# Patient Record
Sex: Female | Born: 1960 | Race: Black or African American | Hispanic: No | Marital: Married | Smoking: Never smoker
Health system: Southern US, Community
[De-identification: ages and names within clinical notes are randomized; demographics above are authoritative.]

## PROBLEM LIST (undated history)

## (undated) DIAGNOSIS — E785 Hyperlipidemia, unspecified: Secondary | ICD-10-CM

---

## 1999-05-08 ENCOUNTER — Encounter: Payer: Self-pay | Admitting: Obstetrics & Gynecology

## 1999-05-08 ENCOUNTER — Inpatient Hospital Stay (HOSPITAL_COMMUNITY): Admission: AD | Admit: 1999-05-08 | Discharge: 1999-05-08 | Payer: Self-pay | Admitting: Obstetrics

## 1999-05-12 ENCOUNTER — Encounter (INDEPENDENT_AMBULATORY_CARE_PROVIDER_SITE_OTHER): Payer: Self-pay | Admitting: Specialist

## 1999-05-12 ENCOUNTER — Ambulatory Visit (HOSPITAL_COMMUNITY): Admission: RE | Admit: 1999-05-12 | Discharge: 1999-05-12 | Payer: Self-pay | Admitting: Obstetrics

## 2000-09-27 ENCOUNTER — Emergency Department (HOSPITAL_COMMUNITY): Admission: EM | Admit: 2000-09-27 | Discharge: 2000-09-28 | Payer: Self-pay | Admitting: Emergency Medicine

## 2000-09-28 ENCOUNTER — Encounter: Payer: Self-pay | Admitting: Emergency Medicine

## 2000-11-11 ENCOUNTER — Ambulatory Visit (HOSPITAL_COMMUNITY): Admission: RE | Admit: 2000-11-11 | Discharge: 2000-11-11 | Payer: Self-pay | Admitting: Gastroenterology

## 2001-01-11 ENCOUNTER — Inpatient Hospital Stay (HOSPITAL_COMMUNITY): Admission: RE | Admit: 2001-01-11 | Discharge: 2001-01-12 | Payer: Self-pay | Admitting: Gynecology

## 2001-01-11 ENCOUNTER — Encounter (INDEPENDENT_AMBULATORY_CARE_PROVIDER_SITE_OTHER): Payer: Self-pay | Admitting: Specialist

## 2003-07-22 ENCOUNTER — Encounter: Admission: RE | Admit: 2003-07-22 | Discharge: 2003-07-22 | Payer: Self-pay | Admitting: Gynecology

## 2003-07-22 ENCOUNTER — Encounter: Payer: Self-pay | Admitting: Gynecology

## 2003-07-23 ENCOUNTER — Other Ambulatory Visit: Admission: RE | Admit: 2003-07-23 | Discharge: 2003-07-23 | Payer: Self-pay | Admitting: Gynecology

## 2004-08-06 ENCOUNTER — Encounter: Admission: RE | Admit: 2004-08-06 | Discharge: 2004-08-06 | Payer: Self-pay | Admitting: Gynecology

## 2004-08-26 ENCOUNTER — Other Ambulatory Visit: Admission: RE | Admit: 2004-08-26 | Discharge: 2004-08-26 | Payer: Self-pay | Admitting: Gynecology

## 2005-08-31 ENCOUNTER — Other Ambulatory Visit: Admission: RE | Admit: 2005-08-31 | Discharge: 2005-08-31 | Payer: Self-pay | Admitting: Gynecology

## 2005-09-22 ENCOUNTER — Encounter: Admission: RE | Admit: 2005-09-22 | Discharge: 2005-09-22 | Payer: Self-pay | Admitting: Gynecology

## 2005-10-11 ENCOUNTER — Ambulatory Visit (HOSPITAL_COMMUNITY): Admission: RE | Admit: 2005-10-11 | Discharge: 2005-10-11 | Payer: Self-pay | Admitting: Gynecology

## 2005-11-24 ENCOUNTER — Ambulatory Visit (HOSPITAL_COMMUNITY): Admission: RE | Admit: 2005-11-24 | Discharge: 2005-11-24 | Payer: Self-pay | Admitting: Gastroenterology

## 2006-10-06 ENCOUNTER — Encounter: Admission: RE | Admit: 2006-10-06 | Discharge: 2006-10-06 | Payer: Self-pay | Admitting: Family Medicine

## 2006-10-10 ENCOUNTER — Other Ambulatory Visit: Admission: RE | Admit: 2006-10-10 | Discharge: 2006-10-10 | Payer: Self-pay | Admitting: Gynecology

## 2007-09-15 ENCOUNTER — Encounter: Admission: RE | Admit: 2007-09-15 | Discharge: 2007-09-15 | Payer: Self-pay | Admitting: Gynecology

## 2007-10-09 ENCOUNTER — Encounter: Admission: RE | Admit: 2007-10-09 | Discharge: 2007-10-09 | Payer: Self-pay | Admitting: Gynecology

## 2007-10-16 ENCOUNTER — Other Ambulatory Visit: Admission: RE | Admit: 2007-10-16 | Discharge: 2007-10-16 | Payer: Self-pay | Admitting: Gynecology

## 2008-08-15 ENCOUNTER — Encounter: Admission: RE | Admit: 2008-08-15 | Discharge: 2008-08-15 | Payer: Self-pay | Admitting: Family Medicine

## 2008-10-29 ENCOUNTER — Encounter: Admission: RE | Admit: 2008-10-29 | Discharge: 2008-10-29 | Payer: Self-pay | Admitting: Gynecology

## 2010-01-01 ENCOUNTER — Encounter: Admission: RE | Admit: 2010-01-01 | Discharge: 2010-01-01 | Payer: Self-pay | Admitting: Gynecology

## 2010-10-18 ENCOUNTER — Encounter: Payer: Self-pay | Admitting: Family Medicine

## 2011-01-14 ENCOUNTER — Other Ambulatory Visit: Payer: Self-pay | Admitting: Family Medicine

## 2011-01-14 DIAGNOSIS — Z1231 Encounter for screening mammogram for malignant neoplasm of breast: Secondary | ICD-10-CM

## 2011-01-29 ENCOUNTER — Other Ambulatory Visit: Payer: Self-pay | Admitting: Gynecology

## 2011-01-29 ENCOUNTER — Ambulatory Visit: Payer: Self-pay

## 2011-01-29 DIAGNOSIS — R928 Other abnormal and inconclusive findings on diagnostic imaging of breast: Secondary | ICD-10-CM

## 2011-02-11 ENCOUNTER — Ambulatory Visit
Admission: RE | Admit: 2011-02-11 | Discharge: 2011-02-11 | Disposition: A | Discharge status: Home / Self Care | Payer: 59 | Source: Ambulatory Visit | Attending: Gynecology | Admitting: Gynecology

## 2011-02-11 DIAGNOSIS — R928 Other abnormal and inconclusive findings on diagnostic imaging of breast: Secondary | ICD-10-CM

## 2011-02-12 NOTE — Procedures (Signed)
[place]  Patient:    Natasha Russo, Natasha Russo                      MRN: [id number] Proc. Date: 11/11/00 Adm. Date:  [id number] Attending:  Charna Elizabeth CC:         Leatha Gilding. Mezer, M.D.             Gabriel Earing, M.D.                           Procedure Report  DATE OF BIRTH:  April 23, 1951.  REFERRING PHYSICIAN:  Leatha Gilding. Mezer, M.D.  PROCEDURE PERFORMED:  Colonoscopy.  ENDOSCOPIST:  Anselmo Rod, M.D.  INSTRUMENT USED:  Olympus video colonoscope.  INDICATIONS FOR PROCEDURE:  A 50 year old African-American female with an abnormal rectal exam, questionable mass on digital exam, rule out colonic polyp.  Patient has a family history of colon cancer.  PREPROCEDURE PREPARATION:  Informed consent was procured from the patient. The patient was fasted for eight hours prior to the procedure and prepped with a bottle of magnesium citrate and a gallon of NuLytely the night prior to the procedure.  PREPROCEDURE PHYSICAL:  The patient had stable vital signs.  Neck supple. Chest clear to auscultation.  S1, S2 regular.  Abdomen soft with normal abdominal bowel sounds.  DESCRIPTION OF PROCEDURE:  The patient was placed in the left lateral decubitus position and sedated with 80 mg of Demerol and 8 mg of Versed intravenously.  Once the patient was adequately sedated and maintained on low-flow oxygen and continuous cardiac monitoring, the Olympus video colonoscope was advanced from the rectum to the cecum with slight difficulty secondary to a very tortuous colon.  The patient has had a cesarean section in the pass.  No mass or polyps were seen.  No diverticulosis was present.  The entire colonic mucosa appeared healthy with normal vascular pattern.  The procedure was complete up to the cecum.  The rectum was closely visualized and retroflexion view showed no abnormalities.  IMPRESSION:  Normal colonoscopy examination up to the cecum.  No masses or polyps  seen.  RECOMMENDATIONS: 1. The patient has been advised to increase the fluid and fiber in the diet. 2. Repeat rectal examination will be done in the office and colorectal cancer    screening will be repeated in the next five years considering her    family history of colon cancer unless she were to develop any abnormal    symptoms in the interim.DD:  11/11/00 TD:  11/11/00 Job: [id number]

## 2011-02-12 NOTE — Op Note (Signed)
[facility]  Patient:    Natasha Russo, Natasha Russo                   MRN: [id number] Proc. Date: 01/11/01 Adm. Date:  [id number] Disc. Date: [id number] Attending:  Teodora Medici Cabitt                           Operative Report  PREOPERATIVE DIAGNOSES:  Pelvic pain, fibroid uterus.  POSTOPERATIVE DIAGNOSES:  Pelvic pain, fibroid uterus.  OPERATION PERFORMED:  Total abdominal hysterectomy.  SURGEON:  Dr. Teodora Medici.  ASSISTANT:  Dr. Rosalee Kaufman.  ANESTHESIA:  General endotracheal.  PREPARATION:  Betadine.  DESCRIPTION OF PROCEDURE:  With the patient in the supine position, she was prepped and draped in the routine fashion. A Pfannenstiel incision was made above the previous scar as it appeared to be too low for a hysterectomy. The subcutaneous tissue, fascia, and peritoneum were opened without difficulty. Brief exploration of her upper abdomen was benign. Exploration of the pelvis revealed the uterus to be approximately 12 weeks in size with multiple leiomyomata. Both ovaries were grossly normal. The round ligaments were suture ligated with #1 chromic and divided and with cautery. The utero-ovarian ligaments were isolated, clamped, cut and free tied with #1 chromic and then suture ligated with #1 chromic. The bladder was quite adherent to the cervix and required careful sharp dissection. The dissection was taken deep to the cervix to protect the bladder. There appeared to be no harm to the bladder. The uterine arteries are clamped, cut and suture ligated with #1 chromic. One bit of the cardinal ligament was taken on each side, clamped, cut and suture ligated with #1 chromic. The uterus was then removed for better exposure. The remainder of the cardinal ligaments were taken in several bits, clamped, cut and suture ligated with #1 chromic. The uterosacral ligaments were taken separately, clamped, cut and suture ligated with #1 chromic. The vagina  was entered anteriorly and the specimen excised with circumferential dissection. The cervix was intact. There was significant scarring of the cervix to the lateral vaginal wall particularly on the right side. The angles of the cuff were closed with TeLinde type angled sutures of #1 chromic. The cuff was then whipped anteriorly and posteriorly with running locked #1 chromic suture. Hemostasis was assured in the operative site and the cuff was reapproximated using interrupted #1 chromic suture. The bladder flap was then placed over the cuff with a running 3-0 Vicryl suture. The ureters were inspected and found to be out of harms way and hemostasis appeared to be intact. An effort was made to place the large bowel in the cul-de-sac, the omentum was brought down and the abdomen was closed in layers using a running 2-0 Vicryl on the peritoneum, running #0 Vicryl to the midline bilaterally on the fascia. Hemostasis was assured in the subcutaneous tissue and the skin was closed with staples. The estimated blood loss was 350 cc. The patient tolerated the procedure well and was taken to the recovery room in satisfactory condition. DD:  01/11/01 TD:  01/12/01 Job: [id number]

## 2011-02-12 NOTE — H&P (Signed)
[facility]  Patient:    Natasha Russo, Natasha Russo                   MRN: [id number] Adm. Date:  [id number] Disc. Date: [id number] Attending:  Teodora Medici Cabitt                         History and Physical  ADMITTING DIAGNOSES:  Pelvic pain and fibroid uterus.  HISTORY OF PRESENT ILLNESS:  The patient is a 50 year old, gravida 3, para 1, AB 2 female admitted with last menstrual period of January 04, 2001, with longstanding increasing pelvic pain and a fibroid uterus for a total abdominal hysterectomy, question bilateral salpingo-oophorectomy.  The patient has a long history of dyspareunia and dysmenorrhea and fibroids.  Currently the patient has very severe incapacitating dysmenorrhea and is unable to have intercourse secondary to pain.  The fibroid uterus is approximately 12 weeks in size and irregular and quite tender to palpation.  The patient wishes to proceed with hysterectomy.  The patient understands that this will result in permanent sterilization and she will never be able to become pregnant again in the future.  She wishes to have her ovaries remove only if there is significant pathology or if surgically necessary.  Postoperative expectations and restrictions have been reviewed with the patient in detail.  Potential complications including, but not limited to, anesthesia, injury to the bowel, bladder, or ureters, possible fistula formation, possible blood loss with transfusion, sequela, and possible infection have been reviewed in detail.  Ultrasound examination reveals the ovaries to be grossly normal.  The patient understands that there is no guarantee to resolve the dyspareunia and that she will no longer have menstrual periods.  PAST MEDICAL HISTORY:  Surgical:  Cesarean section, D&C.  Medical: Noncontributory.  MEDICATIONS:  Cortisone cream.  ALLERGIES:  PENICILLIN (possible allergy, it may just be nausea.)  HABITS:  Smokes none, EtOH  none.  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  FAMILY HISTORY:  Negative for carcinoma.  PHYSICAL EXAMINATION:  HEENT:  Negative.  LUNGS:  Clear.  HEART:  Without murmurs.  BREASTS:  Without masses or discharge.  ABDOMEN:  Soft and nontender.  PELVIC:  Reveals BUS, vagina, and cervix to be normal.  The uterus is approximately 12 weeks in size and irregular and the adnexa without palpable masses.  EXTREMITIES:  Negative.  IMPRESSION:  Fibroid uterus, pelvic pain.  PLAN:  Total abdominal hysterectomy, question bilateral salpingo-oophorectomy. DD:  01/11/01 TD:  01/11/01 Job: [id number]

## 2011-02-12 NOTE — Op Note (Signed)
NAMECIRCE, CHILTON               ACCOUNT NO.:  [id number]   MEDICAL RECORD NO.:  [id number]          PATIENT TYPE:  AMB   LOCATION:  ENDO                         FACILITY:  [facility]   PHYSICIAN:  Anselmo Rod, M.D.  DATE OF BIRTH:  1960-12-01   DATE OF PROCEDURE:  11/24/2005  DATE OF DISCHARGE:  11/24/2005                                 OPERATIVE REPORT   PROCEDURE PERFORMED:  Screening colonoscopy.   ENDOSCOPIST:  Anselmo Rod, M.D.   INSTRUMENT USED:  Olympus video colonoscope.   INDICATIONS FOR PROCEDURE:  A 50 year old African-American female with a  family history of colon cancer undergoing screening colonoscopy to rule out  colonic polyps, masses, etc.   PREPROCEDURE PREPARATION:  Informed consent was procured from the patient.  The patient was fasted for four hours prior to the procedure and prepped  with OsmoPrep pills the night of and the morning of the procedure.  The  risks and benefits of the procedure including a 10% miss rate for cancer or  polyps was discussed with the patient as well.   PREPROCEDURE PHYSICAL:  The patient had stable vital signs.  Neck supple.  Chest clear to auscultation.  S1 and S2 regular.  Abdomen soft with normal  bowel sounds.   DESCRIPTION OF PROCEDURE:  The patient was placed in left lateral decubitus  position and sedated with 100 mcg of fentanyl and 10 mg of Versed in slow  incremental doses.  Once the patient was adequately sedated and maintained  on low flow oxygen and continuous cardiac monitoring, the Olympus video  colonoscope was advanced from the rectum to the cecum.  The appendicular  orifice and ileocecal valve were clearly visualized and photographed.  No  masses, polyps, erosions, ulcerations or diverticula were seen.  Multiple  washes were done. The entire exam was normal.  Retroflexion in the rectum  revealed no abnormalities.  The patient tolerated the procedure well without  complication.   IMPRESSION:  Normal  colonoscopy up to the cecum.  No masses, polyps,  erosions, ulcerations or diverticula were seen.   RECOMMENDATIONS:  1.Considering the patient is not sure about history of  colon cancer in her family, I think repeat colonoscopy in the next 10 years  would be reasonable; however, she has been advised to contact me if ASAP she  has any rectal bleeding, change in bowel habits, etc.  2.Outpatient follow up on a PRN basis.     Anselmo Rod, M.D.  Electronically Signed    JNM/MEDQ  D:  11/25/2005  T:  11/26/2005  Job:  [id number]

## 2012-12-29 ENCOUNTER — Emergency Department (HOSPITAL_COMMUNITY): Payer: No Typology Code available for payment source

## 2012-12-29 ENCOUNTER — Encounter (HOSPITAL_COMMUNITY): Payer: Self-pay

## 2012-12-29 ENCOUNTER — Emergency Department (HOSPITAL_COMMUNITY)
Admission: EM | Admit: 2012-12-29 | Discharge: 2012-12-29 | Disposition: A | Discharge status: Home / Self Care | Payer: No Typology Code available for payment source | Attending: Emergency Medicine | Admitting: Emergency Medicine

## 2012-12-29 DIAGNOSIS — Y9389 Activity, other specified: Secondary | ICD-10-CM | POA: Insufficient documentation

## 2012-12-29 DIAGNOSIS — E785 Hyperlipidemia, unspecified: Secondary | ICD-10-CM | POA: Insufficient documentation

## 2012-12-29 DIAGNOSIS — H538 Other visual disturbances: Secondary | ICD-10-CM | POA: Insufficient documentation

## 2012-12-29 DIAGNOSIS — Y92410 Unspecified street and highway as the place of occurrence of the external cause: Secondary | ICD-10-CM | POA: Insufficient documentation

## 2012-12-29 DIAGNOSIS — R071 Chest pain on breathing: Secondary | ICD-10-CM | POA: Insufficient documentation

## 2012-12-29 DIAGNOSIS — R51 Headache: Secondary | ICD-10-CM | POA: Insufficient documentation

## 2012-12-29 DIAGNOSIS — IMO0001 Reserved for inherently not codable concepts without codable children: Secondary | ICD-10-CM | POA: Insufficient documentation

## 2012-12-29 HISTORY — DX: Hyperlipidemia, unspecified: E78.5

## 2012-12-29 MED ORDER — IBUPROFEN 800 MG PO TABS: 800.0000 mg | ORAL_TABLET | Freq: Three times a day (TID) | ORAL | Status: DC

## 2012-12-29 MED ORDER — IBUPROFEN 800 MG PO TABS
800.0000 mg | ORAL_TABLET | Freq: Once | ORAL | Status: AC
  Administered 2012-12-29: 800 mg via ORAL
  Filled 2012-12-29: qty 1

## 2012-12-29 MED ORDER — HYDROCODONE-ACETAMINOPHEN 5-500 MG PO TABS: 1.0000 | ORAL_TABLET | Freq: Four times a day (QID) | ORAL | Status: DC | PRN

## 2012-12-29 MED ORDER — CYCLOBENZAPRINE HCL 5 MG PO TABS: 5.0000 mg | ORAL_TABLET | Freq: Three times a day (TID) | ORAL | Status: DC | PRN

## 2012-12-29 NOTE — ED Notes (Signed)
Patient transported to X-ray & CT °

## 2012-12-29 NOTE — ED Notes (Signed)
Pt escorted to discharge window. Verbalized understanding discharge instructions. In no acute distress. Vitals reviewed and WDL.  

## 2012-12-29 NOTE — ED Notes (Signed)
MD at bedside. 
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
## 2016-07-09 ENCOUNTER — Encounter (HOSPITAL_COMMUNITY): Payer: Self-pay | Admitting: *Deleted

## 2016-07-09 ENCOUNTER — Ambulatory Visit (HOSPITAL_COMMUNITY)
Admission: EM | Admit: 2016-07-09 | Discharge: 2016-07-09 | Disposition: A | Discharge status: Home / Self Care | Payer: Self-pay | Attending: Emergency Medicine | Admitting: Emergency Medicine

## 2016-07-09 DIAGNOSIS — J0140 Acute pansinusitis, unspecified: Secondary | ICD-10-CM

## 2016-07-09 MED ORDER — ALBUTEROL SULFATE HFA 108 (90 BASE) MCG/ACT IN AERS: 1.0000 | INHALATION_SPRAY | Freq: Four times a day (QID) | RESPIRATORY_TRACT | 0 refills | Status: DC | PRN

## 2016-07-09 MED ORDER — IPRATROPIUM BROMIDE 0.06 % NA SOLN: 2.0000 | Freq: Four times a day (QID) | NASAL | 0 refills | Status: AC

## 2016-07-09 MED ORDER — AEROCHAMBER PLUS MISC: 2 refills | Status: AC

## 2016-07-09 MED ORDER — ALBUTEROL SULFATE HFA 108 (90 BASE) MCG/ACT IN AERS: 1.0000 | INHALATION_SPRAY | Freq: Four times a day (QID) | RESPIRATORY_TRACT | 0 refills | Status: AC | PRN

## 2016-07-09 MED ORDER — DOXYCYCLINE HYCLATE 100 MG PO CAPS: 100.0000 mg | ORAL_CAPSULE | Freq: Two times a day (BID) | ORAL | 0 refills | Status: AC

## 2016-07-09 NOTE — Discharge Instructions (Signed)
Take the medication as written.  Continue guaifenesin/pseudoephedrine. You may take 800 mg of motrin with 1 gram of tylenol up to 3 times a day as needed for pain. This is an effective combination for pain.  Most sinus infections are viral and do not need antibiotics unless you have a high fever, have had this for 10 days, or you get better and then get sick again. Use a neti pot or the NeilMed sinus rinse as often as you want to to reduce nasal congestion. Follow the directions on the box.   Go to www.goodrx.com to look up your medications. This will give you a list of where you can find your prescriptions at the most affordable prices.

## 2016-07-09 NOTE — ED Triage Notes (Signed)
Pt      Reports       Symptoms   Of   Cough       Congestion       Greenish  Material          Pt     Reports  The   Symptoms  X  1  Week       Pt  Reports  Has  Been   Taking   otc  Medications    And     Continues  To  Have   Symptoms

## 2016-07-09 NOTE — ED Provider Notes (Signed)
HPI  SUBJECTIVE:  Natasha Russo is a 55 y.o. female who presents with 1 week of nasal congestion, rhinorrhea, sore throat, postnasal drip, fevers Tmax 100.0. She reports swollen neck glands, sinus pain and pressure and states that her upper teeth hurt. She reports right ear pain and a  right-sided headache. Also purulent nasal drainage. She has been using Afrin, Mucinex D which have improved her symptoms. Symptoms are worse bending forward. She reports a nonproductive cough starting yesterday. No double thickening, wheezing, chest pain, shortness of breath. No allergy type symptoms. No evidence contacts. She did not get a flu shot this year. Past medical history bronchitis, sinusitis. No history of asthma, dizziness, COPD, pneumonia, diabetes, hypertension, allergies. She is not a smoker. PMD: Dr. Gaylan Gerold at  [facility].    Past Medical History:  Diagnosis Date  . Hyperlipidemia     History reviewed. No pertinent surgical history.  History reviewed. No pertinent family history.  Social History  Substance Use Topics  . Smoking status: Never Smoker  . Smokeless tobacco: Not on file  . Alcohol use No    No current facility-administered medications for this encounter.   Current Outpatient Prescriptions:  .  albuterol (PROVENTIL HFA;VENTOLIN HFA) 108 (90 Base) MCG/ACT inhaler, Inhale 1-2 puffs into the lungs every 6 (six) hours as needed for wheezing or shortness of breath., Disp: 1 Inhaler, Rfl: 0 .  doxycycline (VIBRAMYCIN) 100 MG capsule, Take 1 capsule (100 mg total) by mouth 2 (two) times daily. X 7 days, Disp: 14 capsule, Rfl: 0 .  ibuprofen (ADVIL,MOTRIN) 800 MG tablet, Take 1 tablet (800 mg total) by mouth 3 (three) times daily., Disp: 21 tablet, Rfl: 0 .  ipratropium (ATROVENT) 0.06 % nasal spray, Place 2 sprays into both nostrils 4 (four) times daily. 3-4 times/ day, Disp: 15 mL, Rfl: 0 .  Spacer/Aero-Holding Chambers (AEROCHAMBER PLUS) inhaler, Use as instructed, Disp: 1  each, Rfl: 2  Allergies  Allergen Reactions  . Codeine      ROS  As noted in HPI.   Physical Exam  BP 141/74 (BP Location: Right Arm)   Pulse 70   Temp 98.5 F (36.9 C) (Oral)   Resp 12   SpO2 98%   Constitutional: Well developed, well nourished, no acute distress Eyes:  EOMI, conjunctiva normal bilaterally HENT: Normocephalic, atraumatic,mucus membranes moist. TMs normal bilaterally. +  purulent nasal congestion. Swollen  red turbinates. + maxillary sinus tenderness, +  frontal sinus tenderness. Oropharynx  erythematous + postnasal drip.  Respiratory: Normal inspiratory effort And clear bilaterally Neck: No cervical lymphadenopathy. Cardiovascular: Normal rate GI: nondistended skin: No rash, skin intact Musculoskeletal: no deformities Neurologic: Alert & oriented x 3, no focal neuro deficits Psychiatric: Speech and behavior appropriate   ED Course   Medications - No data to display  No orders of the defined types were placed in this encounter.   No results found for this or any previous visit (from the past 24 hour(s)). No results found.  ED Clinical Impression  Acute non-recurrent pansinusitis   ED Assessment/Plan   We'll give a wait-and-see prescription of doxycycline in addition to Atrovent nasal spray, continue mucinex-d, saline nasal irrigation, increase fluids, tylenol/motrin prn pain. Albuterol inhaler with spacer in case she starts having wheezing or shortness breath given the fact that she has a history of reactive airways. Discussed MDM and plan with pt. Discussed sn/sx that should prompt return to the ED. Pt agrees with plan and will f/u with PMD prn  *This  clinic note was created using Scientist, clinical (histocompatibility and immunogenetics)Dragon dictation software. Therefore, there may be occasional mistakes despite careful proofreading.  ?    Domenick GongAshley Dayanne Yiu, MD 07/09/16 2312

## 2016-07-09 NOTE — ED Triage Notes (Signed)
Pt  Reports   Symptoms   Of    Sinus      Congestion      And  Drainage      With      The

## 2016-11-03 ENCOUNTER — Other Ambulatory Visit: Payer: Self-pay | Admitting: Obstetrics and Gynecology

## 2016-11-03 DIAGNOSIS — R928 Other abnormal and inconclusive findings on diagnostic imaging of breast: Secondary | ICD-10-CM

## 2016-11-05 ENCOUNTER — Other Ambulatory Visit: Payer: Self-pay

## 2016-11-08 ENCOUNTER — Ambulatory Visit
Admission: RE | Admit: 2016-11-08 | Discharge: 2016-11-08 | Disposition: A | Discharge status: Home / Self Care | Payer: No Typology Code available for payment source | Source: Ambulatory Visit | Attending: Obstetrics and Gynecology | Admitting: Obstetrics and Gynecology

## 2016-11-08 ENCOUNTER — Other Ambulatory Visit: Payer: Self-pay

## 2016-11-08 DIAGNOSIS — R928 Other abnormal and inconclusive findings on diagnostic imaging of breast: Secondary | ICD-10-CM

## 2017-03-08 ENCOUNTER — Other Ambulatory Visit: Payer: Self-pay | Admitting: Orthopedic Surgery

## 2017-03-08 DIAGNOSIS — M7989 Other specified soft tissue disorders: Secondary | ICD-10-CM

## 2017-03-08 DIAGNOSIS — M79604 Pain in right leg: Secondary | ICD-10-CM

## 2017-03-09 ENCOUNTER — Ambulatory Visit
Admission: RE | Admit: 2017-03-09 | Discharge: 2017-03-09 | Disposition: A | Discharge status: Home / Self Care | Payer: No Typology Code available for payment source | Source: Ambulatory Visit | Attending: Orthopedic Surgery | Admitting: Orthopedic Surgery

## 2017-03-09 DIAGNOSIS — M79604 Pain in right leg: Secondary | ICD-10-CM

## 2017-03-09 DIAGNOSIS — M7989 Other specified soft tissue disorders: Secondary | ICD-10-CM

## 2017-12-31 ENCOUNTER — Ambulatory Visit (HOSPITAL_COMMUNITY)
Admission: EM | Admit: 2017-12-31 | Discharge: 2017-12-31 | Disposition: A | Discharge status: Home / Self Care | Payer: Self-pay | Attending: Internal Medicine | Admitting: Internal Medicine

## 2017-12-31 ENCOUNTER — Ambulatory Visit (INDEPENDENT_AMBULATORY_CARE_PROVIDER_SITE_OTHER): Payer: Self-pay

## 2017-12-31 ENCOUNTER — Encounter (HOSPITAL_COMMUNITY): Payer: Self-pay | Admitting: Emergency Medicine

## 2017-12-31 DIAGNOSIS — S161XXA Strain of muscle, fascia and tendon at neck level, initial encounter: Secondary | ICD-10-CM

## 2017-12-31 DIAGNOSIS — S29012A Strain of muscle and tendon of back wall of thorax, initial encounter: Secondary | ICD-10-CM

## 2017-12-31 DIAGNOSIS — S20219A Contusion of unspecified front wall of thorax, initial encounter: Secondary | ICD-10-CM

## 2017-12-31 MED ORDER — METHOCARBAMOL 500 MG PO TABS: 500.0000 mg | ORAL_TABLET | Freq: Two times a day (BID) | ORAL | 0 refills | Status: DC

## 2017-12-31 MED ORDER — NAPROXEN 500 MG PO TABS: 500.0000 mg | ORAL_TABLET | Freq: Two times a day (BID) | ORAL | 0 refills | Status: AC
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
## 2017-12-31 NOTE — Discharge Instructions (Addendum)
All of your x-rays today look normal.  All of your pain is likely musculoskeletal in origin.  Anti-inflammatories and muscle relaxants will help.  Use ice and rest.
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
## 2019-11-22 ENCOUNTER — Ambulatory Visit: Payer: Self-pay | Attending: Family

## 2019-11-22 DIAGNOSIS — Z23 Encounter for immunization: Secondary | ICD-10-CM | POA: Insufficient documentation

## 2019-11-22 NOTE — Progress Notes (Signed)
   Covid-19 Vaccination Clinic  Name:  Natasha Russo    MRN: [id number] DOB: 09/15/1961  11/22/2019  [name] was observed post Covid-19 immunization for 15 minutes without incidence. She was provided with Vaccine Information Sheet and instruction to access the V-Safe system.   [name] was instructed to call 911 with any severe reactions post vaccine: Marland Kitchen Difficulty breathing  . Swelling of your face and throat  . A fast heartbeat  . A bad rash all over your body  . Dizziness and weakness    Immunizations Administered    Name Date Dose VIS Date Route   Moderna COVID-19 Vaccine 11/22/2019  4:57 PM 0.5 mL 08/28/2019 Intramuscular   Manufacturer: Moderna   Lot: [id number]   NDC: 41290-475-33

## 2019-12-25 ENCOUNTER — Ambulatory Visit: Payer: Self-pay | Attending: Family

## 2019-12-25 DIAGNOSIS — Z23 Encounter for immunization: Secondary | ICD-10-CM

## 2019-12-25 NOTE — Progress Notes (Signed)
   Covid-19 Vaccination Clinic  Name:  Natasha Russo    MRN: [id number] DOB: 1961/04/14  12/25/2019  [name] was observed post Covid-19 immunization for 15 minutes without incident. She was provided with Vaccine Information Sheet and instruction to access the V-Safe system.   [name] was instructed to call 911 with any severe reactions post vaccine: Marland Kitchen Difficulty breathing  . Swelling of face and throat  . A fast heartbeat  . A bad rash all over body  . Dizziness and weakness   Immunizations Administered    Name Date Dose VIS Date Route   Moderna COVID-19 Vaccine 12/25/2019 11:30 AM 0.5 mL 08/28/2019 Intramuscular   Manufacturer: Moderna   Lot: [id number]   NDC: 20233-435-68

## 2020-09-08 ENCOUNTER — Other Ambulatory Visit: Payer: Self-pay | Admitting: Obstetrics and Gynecology

## 2020-10-16 ENCOUNTER — Ambulatory Visit: Payer: Self-pay | Attending: Internal Medicine

## 2020-10-16 DIAGNOSIS — Z23 Encounter for immunization: Secondary | ICD-10-CM

## 2020-10-16 NOTE — Progress Notes (Signed)
   Covid-19 Vaccination Clinic  Name:  Natasha Russo    MRN: [id number] DOB: 1961/04/01  10/16/2020  Natasha Russo was observed post Covid-19 immunization for 15 minutes without incident. She was provided with Vaccine Information Sheet and instruction to access the V-Safe system.   Natasha Russo was instructed to call 911 with any severe reactions post vaccine: Marland Kitchen Difficulty breathing  . Swelling of face and throat  . A fast heartbeat  . A bad rash all over body  . Dizziness and weakness   Immunizations Administered    Name Date Dose VIS Date Route   Moderna Covid-19 Booster Vaccine 10/16/2020  5:04 PM 0.25 mL 07/16/2020 Intramuscular   Manufacturer: Moderna   Lot: [id number]   NDC: 25003-704-88

## 2021-11-04 ENCOUNTER — Other Ambulatory Visit: Payer: Self-pay | Admitting: Obstetrics and Gynecology

## 2021-11-04 DIAGNOSIS — Z800 Family history of malignant neoplasm of digestive organs: Secondary | ICD-10-CM

## 2021-12-11 ENCOUNTER — Other Ambulatory Visit: Payer: Self-pay

## 2023-05-04 ENCOUNTER — Ambulatory Visit (HOSPITAL_COMMUNITY)
Admission: EM | Admit: 2023-05-04 | Discharge: 2023-05-04 | Discharge status: Absent without leave | Payer: BC Managed Care – PPO

## 2023-07-21 ENCOUNTER — Other Ambulatory Visit: Payer: Self-pay | Admitting: Obstetrics and Gynecology

## 2023-07-21 DIAGNOSIS — Z800 Family history of malignant neoplasm of digestive organs: Secondary | ICD-10-CM

## 2023-07-21 DIAGNOSIS — R0989 Other specified symptoms and signs involving the circulatory and respiratory systems: Secondary | ICD-10-CM

## 2023-07-25 ENCOUNTER — Other Ambulatory Visit: Payer: Self-pay | Admitting: Obstetrics and Gynecology

## 2023-07-25 DIAGNOSIS — R928 Other abnormal and inconclusive findings on diagnostic imaging of breast: Secondary | ICD-10-CM

## 2023-07-27 ENCOUNTER — Ambulatory Visit
Admission: RE | Admit: 2023-07-27 | Discharge: 2023-07-27 | Disposition: A | Discharge status: Home / Self Care | Payer: BC Managed Care – PPO | Source: Ambulatory Visit | Attending: Obstetrics and Gynecology | Admitting: Obstetrics and Gynecology

## 2023-07-27 DIAGNOSIS — R928 Other abnormal and inconclusive findings on diagnostic imaging of breast: Secondary | ICD-10-CM

## 2023-07-29 ENCOUNTER — Ambulatory Visit
Admission: RE | Admit: 2023-07-29 | Discharge: 2023-07-29 | Disposition: A | Discharge status: Home / Self Care | Payer: BC Managed Care – PPO | Source: Ambulatory Visit | Attending: Obstetrics and Gynecology | Admitting: Obstetrics and Gynecology

## 2023-07-29 DIAGNOSIS — R0989 Other specified symptoms and signs involving the circulatory and respiratory systems: Secondary | ICD-10-CM

## 2023-08-05 ENCOUNTER — Other Ambulatory Visit: Payer: BC Managed Care – PPO

## 2023-08-08 ENCOUNTER — Encounter: Payer: Self-pay | Admitting: Obstetrics and Gynecology

## 2023-08-12 ENCOUNTER — Ambulatory Visit
Admission: RE | Admit: 2023-08-12 | Discharge: 2023-08-12 | Disposition: A | Discharge status: Home / Self Care | Payer: BC Managed Care – PPO | Source: Ambulatory Visit | Attending: Obstetrics and Gynecology | Admitting: Obstetrics and Gynecology

## 2023-08-12 DIAGNOSIS — Z800 Family history of malignant neoplasm of digestive organs: Secondary | ICD-10-CM

## 2023-08-12 MED ORDER — GADOPICLENOL 0.5 MMOL/ML IV SOLN
9.0000 mL | Freq: Once | INTRAVENOUS | Status: AC | PRN
  Administered 2023-08-12: 9 mL via INTRAVENOUS

## 2024-06-15 ENCOUNTER — Ambulatory Visit (HOSPITAL_COMMUNITY)
Admission: EM | Admit: 2024-06-15 | Discharge: 2024-06-15 | Disposition: A | Discharge status: Home / Self Care | Attending: Internal Medicine | Admitting: Internal Medicine

## 2024-06-15 ENCOUNTER — Ambulatory Visit (INDEPENDENT_AMBULATORY_CARE_PROVIDER_SITE_OTHER)

## 2024-06-15 DIAGNOSIS — M25511 Pain in right shoulder: Secondary | ICD-10-CM | POA: Diagnosis not present

## 2024-06-15 DIAGNOSIS — M62838 Other muscle spasm: Secondary | ICD-10-CM

## 2024-06-15 DIAGNOSIS — M546 Pain in thoracic spine: Secondary | ICD-10-CM

## 2024-06-15 DIAGNOSIS — M542 Cervicalgia: Secondary | ICD-10-CM

## 2024-06-15 DIAGNOSIS — M25521 Pain in right elbow: Secondary | ICD-10-CM

## 2024-06-15 MED ORDER — IBUPROFEN 800 MG PO TABS: 800.0000 mg | ORAL_TABLET | Freq: Three times a day (TID) | ORAL | 0 refills | Status: AC | PRN

## 2024-06-15 MED ORDER — METHOCARBAMOL 500 MG PO TABS: 500.0000 mg | ORAL_TABLET | Freq: Two times a day (BID) | ORAL | 0 refills | Status: DC

## 2024-06-15 MED ORDER — IBUPROFEN 800 MG PO TABS: 800.0000 mg | ORAL_TABLET | Freq: Three times a day (TID) | ORAL | 0 refills | Status: DC | PRN

## 2024-06-15 MED ORDER — METHOCARBAMOL 500 MG PO TABS: 500.0000 mg | ORAL_TABLET | Freq: Two times a day (BID) | ORAL | 0 refills | Status: AC
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
## 2024-07-13 ENCOUNTER — Ambulatory Visit: Admitting: Orthopaedic Surgery

## 2024-07-26 ENCOUNTER — Ambulatory Visit: Admitting: Orthopaedic Surgery

## 2024-07-30 ENCOUNTER — Encounter: Payer: Self-pay | Admitting: Radiology
